# Patient Record
Sex: Male | Born: 2000 | Race: Black or African American | Hispanic: No | Marital: Single | Smoking: Never smoker
Health system: Southern US, Community
[De-identification: ages and names within clinical notes are randomized; demographics above are authoritative.]

## PROBLEM LIST (undated history)

## (undated) DIAGNOSIS — J45909 Unspecified asthma, uncomplicated: Secondary | ICD-10-CM

---
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
## 2020-02-28 DIAGNOSIS — Z419 Encounter for procedure for purposes other than remedying health state, unspecified: Secondary | ICD-10-CM | POA: Diagnosis not present

## 2020-03-30 DIAGNOSIS — Z419 Encounter for procedure for purposes other than remedying health state, unspecified: Secondary | ICD-10-CM | POA: Diagnosis not present

## 2020-04-27 DIAGNOSIS — Z419 Encounter for procedure for purposes other than remedying health state, unspecified: Secondary | ICD-10-CM | POA: Diagnosis not present

## 2020-05-28 DIAGNOSIS — Z419 Encounter for procedure for purposes other than remedying health state, unspecified: Secondary | ICD-10-CM | POA: Diagnosis not present

## 2020-06-27 DIAGNOSIS — Z419 Encounter for procedure for purposes other than remedying health state, unspecified: Secondary | ICD-10-CM | POA: Diagnosis not present

## 2020-07-28 DIAGNOSIS — Z419 Encounter for procedure for purposes other than remedying health state, unspecified: Secondary | ICD-10-CM | POA: Diagnosis not present

## 2020-08-27 DIAGNOSIS — Z419 Encounter for procedure for purposes other than remedying health state, unspecified: Secondary | ICD-10-CM | POA: Diagnosis not present

## 2020-10-28 DIAGNOSIS — Z419 Encounter for procedure for purposes other than remedying health state, unspecified: Secondary | ICD-10-CM | POA: Diagnosis not present

## 2020-11-27 DIAGNOSIS — Z419 Encounter for procedure for purposes other than remedying health state, unspecified: Secondary | ICD-10-CM | POA: Diagnosis not present

## 2020-12-28 DIAGNOSIS — Z419 Encounter for procedure for purposes other than remedying health state, unspecified: Secondary | ICD-10-CM | POA: Diagnosis not present

## 2021-01-27 DIAGNOSIS — Z419 Encounter for procedure for purposes other than remedying health state, unspecified: Secondary | ICD-10-CM | POA: Diagnosis not present

## 2021-02-27 DIAGNOSIS — Z419 Encounter for procedure for purposes other than remedying health state, unspecified: Secondary | ICD-10-CM | POA: Diagnosis not present

## 2021-03-30 DIAGNOSIS — Z419 Encounter for procedure for purposes other than remedying health state, unspecified: Secondary | ICD-10-CM | POA: Diagnosis not present

## 2021-04-27 DIAGNOSIS — Z419 Encounter for procedure for purposes other than remedying health state, unspecified: Secondary | ICD-10-CM | POA: Diagnosis not present

## 2021-05-28 DIAGNOSIS — Z419 Encounter for procedure for purposes other than remedying health state, unspecified: Secondary | ICD-10-CM | POA: Diagnosis not present

## 2021-06-27 DIAGNOSIS — Z419 Encounter for procedure for purposes other than remedying health state, unspecified: Secondary | ICD-10-CM | POA: Diagnosis not present

## 2021-07-28 DIAGNOSIS — Z419 Encounter for procedure for purposes other than remedying health state, unspecified: Secondary | ICD-10-CM | POA: Diagnosis not present

## 2021-08-27 DIAGNOSIS — Z419 Encounter for procedure for purposes other than remedying health state, unspecified: Secondary | ICD-10-CM | POA: Diagnosis not present

## 2021-09-27 DIAGNOSIS — Z419 Encounter for procedure for purposes other than remedying health state, unspecified: Secondary | ICD-10-CM | POA: Diagnosis not present

## 2021-10-28 DIAGNOSIS — Z419 Encounter for procedure for purposes other than remedying health state, unspecified: Secondary | ICD-10-CM | POA: Diagnosis not present

## 2021-11-27 DIAGNOSIS — Z419 Encounter for procedure for purposes other than remedying health state, unspecified: Secondary | ICD-10-CM | POA: Diagnosis not present

## 2021-12-28 DIAGNOSIS — Z419 Encounter for procedure for purposes other than remedying health state, unspecified: Secondary | ICD-10-CM | POA: Diagnosis not present

## 2021-12-30 DIAGNOSIS — Z131 Encounter for screening for diabetes mellitus: Secondary | ICD-10-CM | POA: Diagnosis not present

## 2021-12-30 DIAGNOSIS — Z13220 Encounter for screening for lipoid disorders: Secondary | ICD-10-CM | POA: Diagnosis not present

## 2021-12-30 DIAGNOSIS — Z0000 Encounter for general adult medical examination without abnormal findings: Secondary | ICD-10-CM | POA: Diagnosis not present

## 2021-12-30 DIAGNOSIS — Z23 Encounter for immunization: Secondary | ICD-10-CM | POA: Diagnosis not present

## 2022-01-27 DIAGNOSIS — Z419 Encounter for procedure for purposes other than remedying health state, unspecified: Secondary | ICD-10-CM | POA: Diagnosis not present

## 2022-02-27 DIAGNOSIS — Z419 Encounter for procedure for purposes other than remedying health state, unspecified: Secondary | ICD-10-CM | POA: Diagnosis not present

## 2022-03-30 DIAGNOSIS — Z419 Encounter for procedure for purposes other than remedying health state, unspecified: Secondary | ICD-10-CM | POA: Diagnosis not present

## 2022-04-28 DIAGNOSIS — Z419 Encounter for procedure for purposes other than remedying health state, unspecified: Secondary | ICD-10-CM | POA: Diagnosis not present

## 2022-05-29 DIAGNOSIS — Z419 Encounter for procedure for purposes other than remedying health state, unspecified: Secondary | ICD-10-CM | POA: Diagnosis not present

## 2022-06-19 DIAGNOSIS — H5213 Myopia, bilateral: Secondary | ICD-10-CM | POA: Diagnosis not present

## 2022-06-28 DIAGNOSIS — Z419 Encounter for procedure for purposes other than remedying health state, unspecified: Secondary | ICD-10-CM | POA: Diagnosis not present

## 2022-07-29 DIAGNOSIS — Z419 Encounter for procedure for purposes other than remedying health state, unspecified: Secondary | ICD-10-CM | POA: Diagnosis not present

## 2022-08-28 DIAGNOSIS — Z419 Encounter for procedure for purposes other than remedying health state, unspecified: Secondary | ICD-10-CM | POA: Diagnosis not present

## 2022-09-28 DIAGNOSIS — Z419 Encounter for procedure for purposes other than remedying health state, unspecified: Secondary | ICD-10-CM | POA: Diagnosis not present

## 2022-10-29 DIAGNOSIS — Z419 Encounter for procedure for purposes other than remedying health state, unspecified: Secondary | ICD-10-CM | POA: Diagnosis not present

## 2022-11-28 DIAGNOSIS — Z419 Encounter for procedure for purposes other than remedying health state, unspecified: Secondary | ICD-10-CM | POA: Diagnosis not present

## 2022-12-29 DIAGNOSIS — Z419 Encounter for procedure for purposes other than remedying health state, unspecified: Secondary | ICD-10-CM | POA: Diagnosis not present

## 2023-01-28 DIAGNOSIS — Z419 Encounter for procedure for purposes other than remedying health state, unspecified: Secondary | ICD-10-CM | POA: Diagnosis not present

## 2023-02-07 ENCOUNTER — Ambulatory Visit (INDEPENDENT_AMBULATORY_CARE_PROVIDER_SITE_OTHER): Payer: Medicaid Other | Admitting: Family Medicine

## 2023-02-07 ENCOUNTER — Encounter: Payer: Self-pay | Admitting: Family Medicine

## 2023-02-07 VITALS — BP 100/70 | HR 76 | Resp 12 | Ht 69.0 in | Wt 149.5 lb

## 2023-02-07 DIAGNOSIS — Z1159 Encounter for screening for other viral diseases: Secondary | ICD-10-CM | POA: Diagnosis not present

## 2023-02-07 DIAGNOSIS — J301 Allergic rhinitis due to pollen: Secondary | ICD-10-CM | POA: Diagnosis not present

## 2023-02-07 DIAGNOSIS — J45909 Unspecified asthma, uncomplicated: Secondary | ICD-10-CM

## 2023-02-07 NOTE — Patient Instructions (Addendum)
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  Do not use My Chart to request refills or for acute issues that need immediate attention. If you send a my chart message, it may take a few days to be addressed, specially if I am not in the office.  Please be sure medication list is accurate. If a new problem present, please set up appointment sooner than planned today.

## 2023-02-07 NOTE — Progress Notes (Signed)
HPI: [name] is a 22 y.o. male with a PMHx significant for asthma, hearing loss, non suppurative otitis media, and dermatitis, who is here today to establish care.  Former PCP: not on file Last preventive routine visit: not on file  Exercise: He doesn't do regular exercise. Diet: He doesn't eat out very often and thinks he eats healthy in general.  Sleep: 6-7 hours Alcohol Use: none Smoking: never Vision: He sees an eye doctor when  Dental: UTD on routine dental care.   He lives with his parents. Planning on starting a new job in a few days.  Chronic medical problems:   Asthma:  He hasn't had any symptom in years.  Does not longer has an inhaler at home.  Seasonal allergies, he is not on treatment.  Not currently on any medications.   Denies any history of anxiety or depression.   No significant FMHx of diabetes, HLD, HTN, or MI. Both of his parents and his sister are alive and healthy.   Patient reports no concerns today.   Review of Systems  Constitutional:  Negative for activity change, appetite change, fatigue and fever.  HENT:  Negative for nosebleeds, sore throat and trouble swallowing.   Eyes:  Negative for redness and visual disturbance.  Respiratory:  Negative for cough, shortness of breath and wheezing.   Cardiovascular:  Negative for chest pain, palpitations and leg swelling.  Gastrointestinal:  Negative for abdominal pain, nausea and vomiting.  Endocrine: Negative for cold intolerance and heat intolerance.  Genitourinary:  Negative for decreased urine volume, dysuria and hematuria.  Musculoskeletal:  Negative for gait problem and myalgias.  Skin:  Negative for rash.  Allergic/Immunologic: Positive for environmental allergies.  Neurological:  Negative for syncope, weakness and headaches.  See other pertinent positives and negatives in HPI.  No current outpatient medications on file prior to visit.   No current facility-administered  medications on file prior to visit.   Past Medical History:  Diagnosis Date   Asthma    No Known Allergies  History reviewed. No pertinent family history.  Social History   Socioeconomic History   Marital status: Single    Spouse name: Not on file   Number of children: Not on file   Years of education: Not on file   Highest education level: Not on file  Occupational History   Not on file  Tobacco Use   Smoking status: Never   Smokeless tobacco: Never  Substance and Sexual Activity   Alcohol use: No   Drug use: No   Sexual activity: Not on file  Other Topics Concern   Not on file  Social History Narrative   Not on file   Social Drivers of Health   Financial Resource Strain: Not on file  Food Insecurity: Not on file  Transportation Needs: Not on file  Physical Activity: Not on file  Stress: Not on file  Social Connections: Not on file    Vitals:   02/07/23 1201  BP: 100/70  Pulse: 76  Resp: 12  SpO2: 98%   Body mass index is 22.08 kg/m.  Physical Exam Vitals and nursing note reviewed.  Constitutional:      General: He is not in acute distress.    Appearance: He is well-developed.  HENT:     Head: Normocephalic and atraumatic.     Mouth/Throat:     Mouth: Mucous membranes are moist.     Pharynx: Oropharynx is clear.  Eyes:  Conjunctiva/sclera: Conjunctivae normal.  Cardiovascular:     Rate and Rhythm: Normal rate and regular rhythm.     Pulses:          Posterior tibial pulses are 2+ on the right side and 2+ on the left side.     Heart sounds: No murmur heard. Pulmonary:     Effort: Pulmonary effort is normal. No respiratory distress.     Breath sounds: Normal breath sounds.  Abdominal:     Palpations: Abdomen is soft. There is no hepatomegaly or mass.     Tenderness: There is no abdominal tenderness.  Musculoskeletal:     Right lower leg: No edema.     Left lower leg: No edema.  Lymphadenopathy:     Cervical: No cervical adenopathy.   Skin:    General: Skin is warm.     Findings: No erythema or rash.  Neurological:     Mental Status: He is alert and oriented to person, place, and time.     Cranial Nerves: No cranial nerve deficit.     Gait: Gait normal.  Psychiatric:        Mood and Affect: Mood and affect normal.    ASSESSMENT AND PLAN:  Mr. Risper was seen today to establish care.   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 He is not longer on pharmacologic treatment, has not had symptoms in years.  Seasonal allergic rhinitis due to pollen If needed can try OTC antihistaminic. Currently he is asymptomatic.  Encounter for HCV screening test for low risk patient -     Hepatitis C antibody; Future  Health maintenance: He is due for Tdap, because he was not certain about insurance coverage, recommend to get it at his pharmacy. Declined flu vaccine.  Return in about 1 year (around 02/07/2024).  I, Rolla Etienne Wierda, acting as a scribe for Taryn Shellhammer [place], MD., have documented all relevant documentation on the behalf of Rebel Willcutt [place], MD, as directed by  Gwynne Kemnitz [place], MD while in the presence of Naif Alabi [place], MD.   I, Anastashia Westerfeld [place], MD, have reviewed all documentation for this visit. The documentation on 02/08/23 for the exam, diagnosis, procedures, and orders are all accurate and complete.  Julia Alkhatib G. [place], MD  [facility]. [place] office.

## 2023-02-08 LAB — HEPATITIS C ANTIBODY: Hepatitis C Ab: NONREACTIVE

## 2023-02-28 DIAGNOSIS — Z419 Encounter for procedure for purposes other than remedying health state, unspecified: Secondary | ICD-10-CM | POA: Diagnosis not present

## 2023-03-13 ENCOUNTER — Telehealth: Payer: Self-pay | Admitting: Family Medicine

## 2023-03-13 NOTE — Telephone Encounter (Signed)
 Copied from CRM [id number]. Topic: Appointments - Scheduling Inquiry for Clinic >> Mar 13, 2023  1:39 PM Macario HERO wrote: Reason for CRM: Patient mother Rosaline called asking if patient is required to come in for any shots. If so she is requesting to be contacted to schedule. [phone]

## 2023-03-13 NOTE — Telephone Encounter (Signed)
 Okay to schedule for tetanus vaccine

## 2023-03-13 NOTE — Telephone Encounter (Signed)
 Spoke to pt's mother who stated she would call back to sch appt. Pls sch pt for nurse visit for tetanus vaccine.

## 2023-03-31 DIAGNOSIS — Z419 Encounter for procedure for purposes other than remedying health state, unspecified: Secondary | ICD-10-CM | POA: Diagnosis not present

## 2023-04-28 DIAGNOSIS — Z419 Encounter for procedure for purposes other than remedying health state, unspecified: Secondary | ICD-10-CM | POA: Diagnosis not present

## 2023-06-09 DIAGNOSIS — Z419 Encounter for procedure for purposes other than remedying health state, unspecified: Secondary | ICD-10-CM | POA: Diagnosis not present

## 2023-07-09 DIAGNOSIS — Z419 Encounter for procedure for purposes other than remedying health state, unspecified: Secondary | ICD-10-CM | POA: Diagnosis not present

## 2023-08-09 DIAGNOSIS — Z419 Encounter for procedure for purposes other than remedying health state, unspecified: Secondary | ICD-10-CM | POA: Diagnosis not present

## 2023-09-08 DIAGNOSIS — Z419 Encounter for procedure for purposes other than remedying health state, unspecified: Secondary | ICD-10-CM | POA: Diagnosis not present

## 2023-10-09 DIAGNOSIS — Z419 Encounter for procedure for purposes other than remedying health state, unspecified: Secondary | ICD-10-CM | POA: Diagnosis not present

## 2023-11-09 DIAGNOSIS — Z419 Encounter for procedure for purposes other than remedying health state, unspecified: Secondary | ICD-10-CM | POA: Diagnosis not present

## 2023-12-09 DIAGNOSIS — Z419 Encounter for procedure for purposes other than remedying health state, unspecified: Secondary | ICD-10-CM | POA: Diagnosis not present
